# Patient Record
Sex: Male | Born: 1965 | Race: White | Hispanic: No | Marital: Married | Smoking: Former smoker
Health system: Southern US, Community
[De-identification: ages and names within clinical notes are randomized; demographics above are authoritative.]

## PROBLEM LIST (undated history)

## (undated) DIAGNOSIS — E7800 Pure hypercholesterolemia, unspecified: Secondary | ICD-10-CM

## (undated) DIAGNOSIS — G43909 Migraine, unspecified, not intractable, without status migrainosus: Secondary | ICD-10-CM

## (undated) DIAGNOSIS — G4733 Obstructive sleep apnea (adult) (pediatric): Secondary | ICD-10-CM

## (undated) HISTORY — DX: Obstructive sleep apnea (adult) (pediatric): G47.33

## (undated) HISTORY — PX: VASECTOMY: SHX75

## (undated) HISTORY — PX: COLONOSCOPY: SHX174

## (undated) HISTORY — DX: Migraine, unspecified, not intractable, without status migrainosus: G43.909

---

## 2011-09-13 ENCOUNTER — Ambulatory Visit: Payer: 59

## 2011-09-13 DIAGNOSIS — L02519 Cutaneous abscess of unspecified hand: Secondary | ICD-10-CM

## 2011-09-13 DIAGNOSIS — M25549 Pain in joints of unspecified hand: Secondary | ICD-10-CM

## 2016-06-16 ENCOUNTER — Other Ambulatory Visit: Payer: Self-pay | Admitting: Gastroenterology

## 2016-06-16 DIAGNOSIS — K529 Noninfective gastroenteritis and colitis, unspecified: Secondary | ICD-10-CM

## 2016-06-24 ENCOUNTER — Other Ambulatory Visit: Payer: Self-pay

## 2016-06-25 ENCOUNTER — Other Ambulatory Visit: Payer: Self-pay

## 2016-06-26 ENCOUNTER — Ambulatory Visit
Admission: RE | Admit: 2016-06-26 | Discharge: 2016-06-26 | Disposition: A | Discharge status: Home / Self Care | Payer: Managed Care, Other (non HMO) | Source: Ambulatory Visit | Attending: Gastroenterology | Admitting: Gastroenterology

## 2016-06-26 DIAGNOSIS — K529 Noninfective gastroenteritis and colitis, unspecified: Secondary | ICD-10-CM

## 2016-06-26 MED ORDER — IOPAMIDOL (ISOVUE-300) INJECTION 61%
100.0000 mL | Freq: Once | INTRAVENOUS | Status: AC | PRN
  Administered 2016-06-26: 100 mL via INTRAVENOUS

## 2016-07-02 ENCOUNTER — Ambulatory Visit
Admission: RE | Admit: 2016-07-02 | Discharge: 2016-07-02 | Disposition: A | Discharge status: Home / Self Care | Payer: Managed Care, Other (non HMO) | Source: Ambulatory Visit | Attending: Gastroenterology | Admitting: Gastroenterology

## 2016-07-02 ENCOUNTER — Other Ambulatory Visit: Payer: Self-pay | Admitting: Gastroenterology

## 2016-07-02 DIAGNOSIS — R935 Abnormal findings on diagnostic imaging of other abdominal regions, including retroperitoneum: Secondary | ICD-10-CM

## 2016-07-07 ENCOUNTER — Other Ambulatory Visit: Payer: Self-pay | Admitting: Physician Assistant

## 2016-07-07 DIAGNOSIS — R9389 Abnormal findings on diagnostic imaging of other specified body structures: Secondary | ICD-10-CM

## 2016-07-08 ENCOUNTER — Other Ambulatory Visit: Payer: Self-pay | Admitting: Physician Assistant

## 2016-07-08 DIAGNOSIS — R9389 Abnormal findings on diagnostic imaging of other specified body structures: Secondary | ICD-10-CM

## 2016-07-16 ENCOUNTER — Ambulatory Visit
Admission: RE | Admit: 2016-07-16 | Discharge: 2016-07-16 | Disposition: A | Discharge status: Home / Self Care | Payer: Managed Care, Other (non HMO) | Source: Ambulatory Visit | Attending: Physician Assistant | Admitting: Physician Assistant

## 2016-07-16 ENCOUNTER — Other Ambulatory Visit: Payer: Managed Care, Other (non HMO)

## 2016-07-16 DIAGNOSIS — R9389 Abnormal findings on diagnostic imaging of other specified body structures: Secondary | ICD-10-CM

## 2016-07-16 MED ORDER — GADOBENATE DIMEGLUMINE 529 MG/ML IV SOLN: 20.0000 mL | Freq: Once | INTRAVENOUS | Status: DC | PRN

## 2017-05-07 ENCOUNTER — Ambulatory Visit
Admission: RE | Admit: 2017-05-07 | Discharge: 2017-05-07 | Disposition: A | Discharge status: Home / Self Care | Payer: Managed Care, Other (non HMO) | Source: Ambulatory Visit | Attending: Physician Assistant | Admitting: Physician Assistant

## 2017-05-07 ENCOUNTER — Other Ambulatory Visit: Payer: Self-pay | Admitting: Physician Assistant

## 2017-05-07 DIAGNOSIS — R52 Pain, unspecified: Secondary | ICD-10-CM

## 2017-07-13 IMAGING — CR DG ORBITS FOR FOREIGN BODY
2 series · 2 of 2 positions shown · non-contrast
Comparison: None.

CLINICAL DATA: Metal working/exposure; clearance prior to MRI

EXAM:
ORBITS FOR FOREIGN BODY - 2 VIEW

[w orbit pa (1 of 2)]
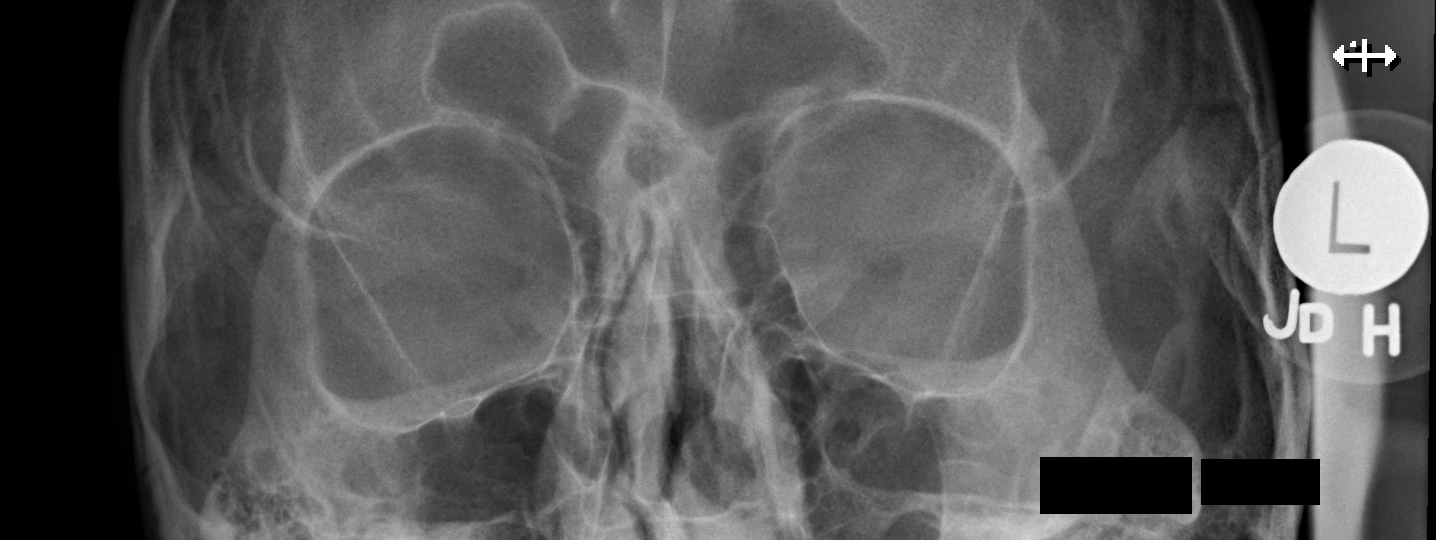

[w orbit pa (2 of 2)]
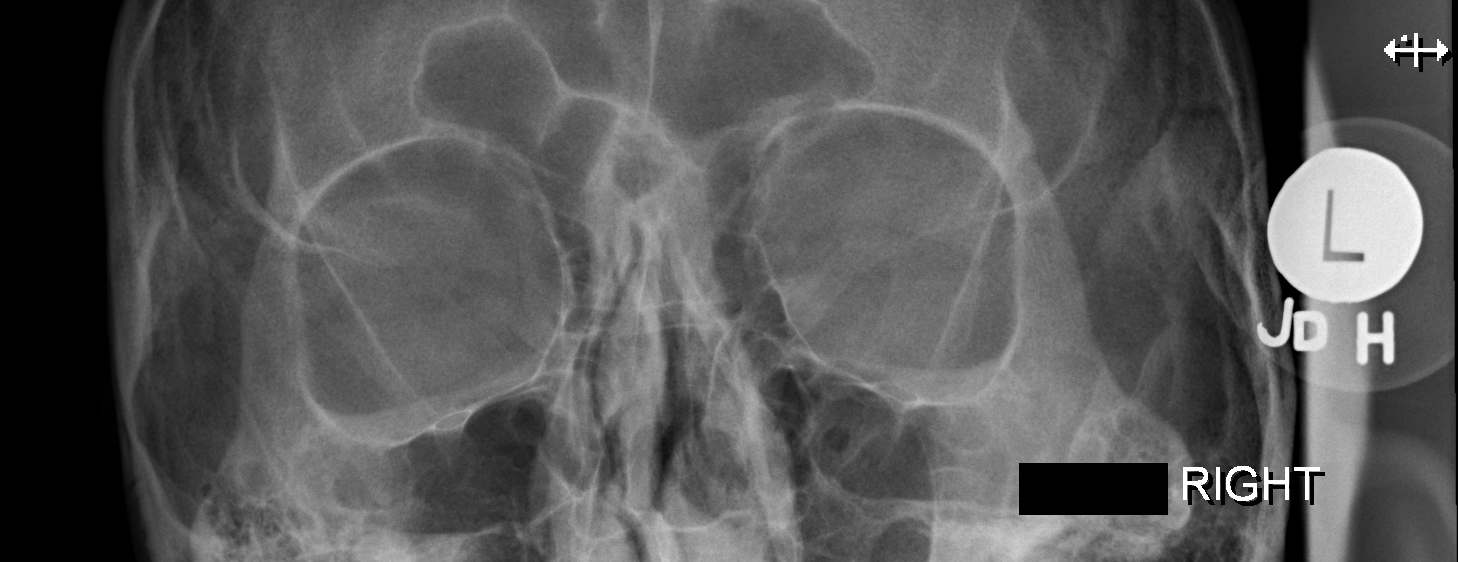

[2 of 2 positions shown; findings below may reference images not displayed]

FINDINGS: There is no evidence of metallic foreign body within the orbits. No
significant bone abnormality identified.
IMPRESSION: No evidence of metallic foreign body within the orbits.

## 2019-05-30 ENCOUNTER — Other Ambulatory Visit: Payer: Self-pay

## 2019-05-30 DIAGNOSIS — Z20822 Contact with and (suspected) exposure to covid-19: Secondary | ICD-10-CM

## 2019-05-31 LAB — NOVEL CORONAVIRUS, NAA: SARS-CoV-2, NAA: NOT DETECTED

## 2019-08-23 ENCOUNTER — Ambulatory Visit: Payer: Managed Care, Other (non HMO) | Attending: Internal Medicine

## 2019-08-23 DIAGNOSIS — Z20822 Contact with and (suspected) exposure to covid-19: Secondary | ICD-10-CM

## 2019-08-23 DIAGNOSIS — Z20828 Contact with and (suspected) exposure to other viral communicable diseases: Secondary | ICD-10-CM | POA: Insufficient documentation

## 2019-08-24 LAB — NOVEL CORONAVIRUS, NAA: SARS-CoV-2, NAA: NOT DETECTED

## 2019-09-26 ENCOUNTER — Encounter (HOSPITAL_COMMUNITY): Payer: Self-pay

## 2019-09-26 ENCOUNTER — Other Ambulatory Visit: Payer: Self-pay

## 2019-09-26 ENCOUNTER — Emergency Department (HOSPITAL_COMMUNITY): Payer: Managed Care, Other (non HMO)

## 2019-09-26 ENCOUNTER — Emergency Department (HOSPITAL_COMMUNITY)
Admission: EM | Admit: 2019-09-26 | Discharge: 2019-09-26 | Disposition: A | Discharge status: Home / Self Care | Payer: Managed Care, Other (non HMO) | Attending: Emergency Medicine | Admitting: Emergency Medicine

## 2019-09-26 DIAGNOSIS — N200 Calculus of kidney: Secondary | ICD-10-CM | POA: Insufficient documentation

## 2019-09-26 DIAGNOSIS — R1032 Left lower quadrant pain: Secondary | ICD-10-CM | POA: Diagnosis present

## 2019-09-26 HISTORY — DX: Pure hypercholesterolemia, unspecified: E78.00

## 2019-09-26 LAB — COMPREHENSIVE METABOLIC PANEL
ALT: 108 U/L — ABNORMAL HIGH (ref 0–44)
AST: 54 U/L — ABNORMAL HIGH (ref 15–41)
Albumin: 4.6 g/dL (ref 3.5–5.0)
Alkaline Phosphatase: 75 U/L (ref 38–126)
Anion gap: 13 (ref 5–15)
BUN: 16 mg/dL (ref 6–20)
CO2: 23 mmol/L (ref 22–32)
Calcium: 10.4 mg/dL — ABNORMAL HIGH (ref 8.9–10.3)
Chloride: 107 mmol/L (ref 98–111)
Creatinine, Ser: 1.19 mg/dL (ref 0.61–1.24)
GFR calc Af Amer: >60 mL/min (ref >60)
GFR calc non Af Amer: >60 mL/min (ref >60)
Glucose, Bld: 147 mg/dL — ABNORMAL HIGH (ref 70–99)
Potassium: 4.1 mmol/L (ref 3.5–5.1)
Sodium: 143 mmol/L (ref 135–145)
Total Bilirubin: 0.6 mg/dL (ref 0.3–1.2)
Total Protein: 8.5 g/dL — ABNORMAL HIGH (ref 6.5–8.1)

## 2019-09-26 LAB — URINALYSIS, ROUTINE W REFLEX MICROSCOPIC
Bilirubin Urine: NEGATIVE
Glucose, UA: NEGATIVE mg/dL
Hgb urine dipstick: NEGATIVE
Ketones, ur: NEGATIVE mg/dL
Leukocytes,Ua: NEGATIVE
Nitrite: NEGATIVE
Protein, ur: 100 mg/dL — AB
Specific Gravity, Urine: 1.027 (ref 1.005–1.030)
pH: 5 (ref 5.0–8.0)

## 2019-09-26 LAB — CBC
HCT: 46.8 % (ref 39.0–52.0)
Hemoglobin: 15.4 g/dL (ref 13.0–17.0)
MCH: 29.4 pg (ref 26.0–34.0)
MCHC: 32.9 g/dL (ref 30.0–36.0)
MCV: 89.3 fL (ref 80.0–100.0)
Platelets: 257 10*3/uL (ref 150–400)
RBC: 5.24 MIL/uL (ref 4.22–5.81)
RDW: 12 % (ref 11.5–15.5)
WBC: 8.1 10*3/uL (ref 4.0–10.5)
nRBC: 0 % (ref 0.0–0.2)

## 2019-09-26 LAB — LIPASE, BLOOD: Lipase: 33 U/L (ref 11–51)

## 2019-09-26 MED ORDER — OXYCODONE-ACETAMINOPHEN 5-325 MG PO TABS: 1.0000 | ORAL_TABLET | Freq: Four times a day (QID) | ORAL | 0 refills | Status: DC | PRN

## 2019-09-26 MED ORDER — NAPROXEN 500 MG PO TABS: 500.0000 mg | ORAL_TABLET | Freq: Two times a day (BID) | ORAL | 0 refills | Status: DC | PRN

## 2019-09-26 MED ORDER — MORPHINE SULFATE (PF) 4 MG/ML IV SOLN
4.0000 mg | Freq: Once | INTRAVENOUS | Status: AC
  Administered 2019-09-26: 4 mg via INTRAVENOUS
  Filled 2019-09-26: qty 1

## 2019-09-26 MED ORDER — ONDANSETRON HCL 4 MG/2ML IJ SOLN
4.0000 mg | Freq: Once | INTRAMUSCULAR | Status: AC
  Administered 2019-09-26: 4 mg via INTRAVENOUS
  Filled 2019-09-26: qty 2

## 2019-09-26 MED ORDER — KETOROLAC TROMETHAMINE 30 MG/ML IJ SOLN
30.0000 mg | Freq: Once | INTRAMUSCULAR | Status: AC
  Administered 2019-09-26: 30 mg via INTRAVENOUS
  Filled 2019-09-26: qty 1

## 2019-09-26 MED ORDER — TAMSULOSIN HCL 0.4 MG PO CAPS: 0.4000 mg | ORAL_CAPSULE | Freq: Every day | ORAL | 0 refills | Status: AC

## 2019-09-26 MED ORDER — IOHEXOL 300 MG/ML  SOLN
100.0000 mL | Freq: Once | INTRAMUSCULAR | Status: AC | PRN
  Administered 2019-09-26: 100 mL via INTRAVENOUS

## 2019-09-26 MED ORDER — SODIUM CHLORIDE 0.9% FLUSH: 3.0000 mL | Freq: Once | INTRAVENOUS | Status: DC

## 2019-09-26 MED ORDER — ONDANSETRON 4 MG PO TBDP: 4.0000 mg | ORAL_TABLET | Freq: Three times a day (TID) | ORAL | 0 refills | Status: DC | PRN

## 2019-09-26 NOTE — ED Provider Notes (Signed)
Emergency Department Provider Note   I have reviewed the triage vital signs and the nursing notes.   HISTORY  Chief Complaint Abdominal Pain and Nausea   HPI Ronald Lee is a 54 y.o. male with PMH of HLD presents to the emergency department for evaluation of acute onset left lower quadrant abdominal pain starting 2 hours prior to ED presentation.  Patient states that he has been treated for a urinary tract infection starting on Friday with Cipro prescribed by his PCP.  He was experiencing some mild dysuria with frequency and some urgency.  He continues to have these symptoms despite antibiotics over the past 3 days.  This morning, he developed acute, severe lower abdominal pain on the left side.  He describes it as pressure and sharp in quality.  No similar pain like this in the past.  No prior history of diverticulitis or ureterolithiasis.  He did have one episode of mild diarrhea this morning.  No nausea or vomiting.  No chest pain or shortness of breath symptoms.  No fever/chills.   Past Medical History:  Diagnosis Date  . High cholesterol     There are no problems to display for this patient.   History reviewed. No pertinent surgical history.  Allergies Patient has no known allergies.  No family history on file.  Social History Social History   Tobacco Use  . Smoking status: Not on file  Substance Use Topics  . Alcohol use: Not on file  . Drug use: Not on file    Review of Systems  Constitutional: No fever/chills Eyes: No visual changes. ENT: No sore throat. Cardiovascular: Denies chest pain. Respiratory: Denies shortness of breath. Gastrointestinal: Positive LLQ abdominal pain.  No nausea, no vomiting.  Positive diarrhea x 1.  No constipation. Genitourinary: Positive dysuria and frequency.  Musculoskeletal: Left flank pain this AM (resolved).  Skin: Negative for rash. Neurological: Negative for headaches, focal weakness or numbness.  10-point ROS otherwise  negative.  ____________________________________________   PHYSICAL EXAM:  VITAL SIGNS: ED Triage Vitals  Enc Vitals Group     BP 09/26/19 0832 (!) 140/5     Pulse Rate 09/26/19 0832 99     Resp 09/26/19 0832 20     Temp 09/26/19 0832 (!) 97.5 F (36.4 C)     Temp Source 09/26/19 0832 Oral     SpO2 09/26/19 0832 99 %   Constitutional: Alert and oriented. Appears uncomfortable but in no acute distress.  Eyes: Conjunctivae are normal. Head: Atraumatic. Nose: No congestion/rhinnorhea. Mouth/Throat: Mucous membranes are moist. Neck: No stridor.   Cardiovascular: Normal rate, regular rhythm. Good peripheral circulation. Grossly normal heart sounds.   Respiratory: Normal respiratory effort.  No retractions. Lungs CTAB. Gastrointestinal: Soft with focal LLQ tenderness w/o rebound. Mild voluntary guarding in the LLQ. Remaining abdomen with mild tenderness but nothing focal. No distention.  Musculoskeletal: No gross deformities of extremities. Neurologic:  Normal speech and language.  Skin:  Skin is warm, dry and intact. No rash noted.  ____________________________________________   LABS (all labs ordered are listed, but only abnormal results are displayed)  Labs Reviewed  COMPREHENSIVE METABOLIC PANEL - Abnormal; Notable for the following components:      Result Value   Glucose, Bld 147 (*)    Calcium 10.4 (*)    Total Protein 8.5 (*)    AST 54 (*)    ALT 108 (*)    All other components within normal limits  URINALYSIS, ROUTINE W REFLEX MICROSCOPIC - Abnormal;  Notable for the following components:   APPearance CLOUDY (*)    Protein, ur 100 (*)    Bacteria, UA FEW (*)    All other components within normal limits  URINE CULTURE  LIPASE, BLOOD  CBC   ____________________________________________  RADIOLOGY  CT ABDOMEN PELVIS W CONTRAST  Result Date: 09/26/2019 CLINICAL DATA:  Left lower quadrant pain. Diverticulitis versus kidney stone. EXAM: CT ABDOMEN AND PELVIS WITH  CONTRAST TECHNIQUE: Multidetector CT imaging of the abdomen and pelvis was performed using the standard protocol following bolus administration of intravenous contrast. CONTRAST:  171mL OMNIPAQUE IOHEXOL 300 MG/ML  SOLN COMPARISON:  CT abdomen pelvis 06/26/2016 FINDINGS: Lower chest: Lung bases clear bilaterally. Hepatobiliary: Fatty infiltration of liver without focal liver lesion. Gallbladder and bile ducts negative. Pancreas: Negative Spleen: Negative Adrenals/Urinary Tract: Mild left hydronephrosis and hydroureter. Mild stranding friend in the left kidney due to obstruction. 1 x 2 mm calculus distal left ureter at the UV junction causing obstruction. No other renal calculi. No renal mass. Stomach/Bowel: Stomach is within normal limits. Appendix appears normal. No evidence of bowel wall thickening, distention, or inflammatory changes. Vascular/Lymphatic: Mild atherosclerotic aorta without aneurysm. Negative for lymphadenopathy. Reproductive: Prostate is unremarkable. Other: No free fluid or mass lesion. Musculoskeletal: Negative IMPRESSION: 1 x 2 mm stone distal left ureter causing obstruction. Negative for diverticulitis.  Normal appendix. Electronically Signed   By: Franchot Gallo M.D.   On: 09/26/2019 11:16    ____________________________________________   PROCEDURES  Procedure(s) performed:   Procedures  None  ____________________________________________   INITIAL IMPRESSION / ASSESSMENT AND PLAN / ED COURSE  Pertinent labs & imaging results that were available during my care of the patient were reviewed by me and considered in my medical decision making (see chart for details).   Patient presents to the emergency department for evaluation of acute onset left lower quadrant abdominal pain starting this morning.  He has been on Cipro for the past 3 days for presumed urinary tract infection.  Afebrile here with mild elevated blood pressure and borderline tachycardia.  Patient does appear  uncomfortable.  Differential at this time includes kidney stone, diverticulitis versus colitis.  Very low suspicion clinically for aortic dissection/symptomatic aneurysm.  Plan for CT abdomen pelvis with contrast along with labs and have ordered pain/nausea medication.   11:35 AM  CT scan showing 1 to 2 mm left UVJ stone which correlates with the patient's symptoms.  No evidence of urine infection on UA.  Patient's symptoms are well controlled here in the emergency department after Toradol.  Plan for NSAIDs, Percocet, Flomax.  Discussed diagnosis and ED return precautions for fever or other infection symptoms.  Provided contact information for urology for follow-up.  Reviewed the [facility] drug database prior to prescribing Percocet.  Patient is comfortable with the plan at discharge.  ____________________________________________  FINAL CLINICAL IMPRESSION(S) / ED DIAGNOSES  Final diagnoses:  LLQ abdominal pain  Kidney stone     MEDICATIONS GIVEN DURING THIS VISIT:  Medications  sodium chloride flush (NS) 0.9 % injection 3 mL (has no administration in time range)  morphine 4 MG/ML injection 4 mg (4 mg Intravenous Given 09/26/19 0906)  ondansetron (ZOFRAN) injection 4 mg (4 mg Intravenous Given 09/26/19 0906)  ketorolac (TORADOL) 30 MG/ML injection 30 mg (30 mg Intravenous Given 09/26/19 1009)  iohexol (OMNIPAQUE) 300 MG/ML solution 100 mL (100 mLs Intravenous Contrast Given 09/26/19 1055)     NEW OUTPATIENT MEDICATIONS STARTED DURING THIS VISIT:  New Prescriptions   NAPROXEN (  NAPROSYN) 500 MG TABLET    Take 1 tablet (500 mg total) by mouth 2 (two) times daily as needed for moderate pain.   ONDANSETRON (ZOFRAN ODT) 4 MG DISINTEGRATING TABLET    Take 1 tablet (4 mg total) by mouth every 8 (eight) hours as needed for nausea or vomiting.   OXYCODONE-ACETAMINOPHEN (PERCOCET/ROXICET) 5-325 MG TABLET    Take 1 tablet by mouth every 6 (six) hours as needed for severe pain.   TAMSULOSIN (FLOMAX)  0.4 MG CAPS CAPSULE    Take 1 capsule (0.4 mg total) by mouth daily for 14 days.    Note:  This document was prepared using Dragon voice recognition software and may include unintentional dictation errors.  Alona Bene, MD, [facility]    Aikam Hellickson, Arlyss Repress, MD 09/26/19 [phone]

## 2019-09-26 NOTE — ED Triage Notes (Signed)
Pt reports severe LLQ pain that started in his lower back and radiates to the front. Pt also having nausea, no vomiting. States he was diagnosed with UTI on Friday and given antibiotics but he woke up in severe pain this morning.

## 2019-09-26 NOTE — Discharge Instructions (Signed)

## 2019-09-27 LAB — URINE CULTURE: Culture: NO GROWTH

## 2019-11-17 ENCOUNTER — Other Ambulatory Visit: Payer: Self-pay | Admitting: Gastroenterology

## 2019-11-17 ENCOUNTER — Encounter: Payer: Self-pay | Admitting: Pulmonary Disease

## 2019-11-17 DIAGNOSIS — R911 Solitary pulmonary nodule: Secondary | ICD-10-CM

## 2019-11-29 ENCOUNTER — Ambulatory Visit
Admission: RE | Admit: 2019-11-29 | Discharge: 2019-11-29 | Disposition: A | Discharge status: Home / Self Care | Payer: Managed Care, Other (non HMO) | Source: Ambulatory Visit | Attending: Gastroenterology | Admitting: Gastroenterology

## 2019-11-29 ENCOUNTER — Other Ambulatory Visit: Payer: Self-pay

## 2019-11-29 ENCOUNTER — Other Ambulatory Visit: Payer: Managed Care, Other (non HMO)

## 2019-11-29 DIAGNOSIS — R911 Solitary pulmonary nodule: Secondary | ICD-10-CM

## 2020-01-02 ENCOUNTER — Encounter: Payer: Self-pay | Admitting: Pulmonary Disease

## 2020-01-02 ENCOUNTER — Other Ambulatory Visit: Payer: Self-pay

## 2020-01-02 ENCOUNTER — Ambulatory Visit (INDEPENDENT_AMBULATORY_CARE_PROVIDER_SITE_OTHER): Payer: Managed Care, Other (non HMO) | Admitting: Pulmonary Disease

## 2020-01-02 VITALS — BP 130/80 | HR 87 | Ht 72.0 in | Wt 244.6 lb

## 2020-01-02 DIAGNOSIS — J438 Other emphysema: Secondary | ICD-10-CM | POA: Diagnosis not present

## 2020-01-02 DIAGNOSIS — Z87891 Personal history of nicotine dependence: Secondary | ICD-10-CM

## 2020-01-02 DIAGNOSIS — R918 Other nonspecific abnormal finding of lung field: Secondary | ICD-10-CM | POA: Diagnosis not present

## 2020-01-02 NOTE — Patient Instructions (Addendum)
Thank you for visiting Dr. Tonia Brooms at [facility]. Today we recommend the following:  Orders Placed This Encounter  Procedures  . CT Chest Wo Contrast   Follow up in clinic after CT images.   Return in about 6 months (around 07/04/2020) for w/ Arkin Imran .    Please do your part to reduce the spread of COVID-19.

## 2020-01-02 NOTE — Progress Notes (Signed)
Synopsis: Referred in May 2021 for multiple pulmonary nodules by Otis Brace, MD  Subjective:   PATIENT ID: Ronald Lee GENDER: male DOB: [birth year], MRN: [id number]  Chief Complaint  Patient presents with  . Consult    Pt states he had a CT scan performed which showed lung nodules. Pt states he will occ become SOB.    This is a 54 year old gentleman with a past medical history of hyperlipidemia, migraines, severe OSA, severe nocturnal hypoxemia with a home sleep study on 10/03/2016, AHI of 54.  He had a lung nodule also initially seen on CT imaging in 2017 with no additional follow-up.  Surgical history to include vasectomy, colonoscopy in 2017, family history of father deceased, hyperlipidemia, Alzheimer's, hypertension mother, alive.  He is a former smoker quit in 1997, tobacco history.  He is married with 3 children.  He works at [employer].  Patient was referred for evaluation after having CT scan of the chest which revealed pulmonary nodules.  11/29/2019 CT chest revealed paraseptal emphysema in the lung apices with associated multiple pulmonary nodules the largest being 6 mm in the right lung base.  He has not had any previous pulmonary function tests on record no prior diagnosis of COPD.  However CT imaging does have evidence of emphysema.  OV 01/02/2020: Patient has no complaints today.  He works as a Freight forwarder.  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  He is slightly concerned today about this.  He did quit smoking many years ago.  No respiratory complaints at this time.  Denies hemoptysis.  Denies weight loss.   Past Medical History:  Diagnosis Date  . High cholesterol   . Migraine   . OSA (obstructive sleep apnea)      Family History  Problem Relation Age of Onset  . Colon polyps Mother   . Alzheimer's disease Father   . Hyperlipidemia Father   . Hypertension Father      Past Surgical History:  Procedure Laterality Date  . COLONOSCOPY    . VASECTOMY       Social History   Socioeconomic History  . Marital status: Married    Spouse name: Not on file  . Number of children: Not on file  . Years of education: Not on file  . Highest education level: Not on file  Occupational History  . Not on file  Tobacco Use  . Smoking status: Former Smoker    Packs/day: 1.00    Years: 10.00    Pack years: 10.00    Types: Cigarettes    Quit date: 1997    Years since quitting: 24.3  . Smokeless tobacco: Current User  Substance and Sexual Activity  . Alcohol use: Not on file  . Drug use: Not on file  . Sexual activity: Not on file  Other Topics Concern  . Not on file  Social History Narrative  . Not on file   Social Determinants of Health   Financial Resource Strain:   . Difficulty of Paying Living Expenses:   Food Insecurity:   . Worried About Charity fundraiser in the Last Year:   . Arboriculturist in the Last Year:   Transportation Needs:   . Film/video editor (Medical):   Marland Kitchen Lack of Transportation (Non-Medical):   Physical Activity:   . Days of Exercise per Week:   . Minutes of Exercise per Session:   Stress:   . Feeling of Stress :   Social Connections:   .  Frequency of Communication with Friends and Family:   . Frequency of Social Gatherings with Friends and Family:   . Attends Religious Services:   . Active Member of Clubs or Organizations:   . Attends Banker Meetings:   Marland Kitchen Marital Status:   Intimate Partner Violence:   . Fear of Current or Ex-Partner:   . Emotionally Abused:   Marland Kitchen Physically Abused:   . Sexually Abused:      No Known Allergies   Outpatient Medications Prior to Visit  Medication Sig Dispense Refill  . atorvastatin (LIPITOR) 20 MG tablet Take 20 mg by mouth daily.    . fexofenadine (ALLEGRA) 60 MG tablet Take 60 mg by mouth as needed for allergies or rhinitis.    . fluticasone (FLONASE) 50 MCG/ACT nasal spray Place 2 sprays into both nostrils daily.    . Multiple Vitamin  (MULTIVITAMIN WITH MINERALS) TABS tablet Take 1 tablet by mouth daily.    Marland Kitchen VITAMIN D PO Take 1 tablet by mouth daily.    . ciprofloxacin (CIPRO) 500 MG tablet Take 500 mg by mouth 2 (two) times daily.    . meloxicam (MOBIC) 7.5 MG tablet Take 7.5 mg by mouth daily.    . naproxen (NAPROSYN) 500 MG tablet Take 1 tablet (500 mg total) by mouth 2 (two) times daily as needed for moderate pain. 30 tablet 0  . ondansetron (ZOFRAN ODT) 4 MG disintegrating tablet Take 1 tablet (4 mg total) by mouth every 8 (eight) hours as needed for nausea or vomiting. 20 tablet 0  . oxyCODONE-acetaminophen (PERCOCET/ROXICET) 5-325 MG tablet Take 1 tablet by mouth every 6 (six) hours as needed for severe pain. 15 tablet 0   No facility-administered medications prior to visit.    Review of Systems  Constitutional: Negative for chills, fever, malaise/fatigue and weight loss.  HENT: Negative for hearing loss, sore throat and tinnitus.   Eyes: Negative for blurred vision and double vision.  Respiratory: Negative for cough, hemoptysis, sputum production, shortness of breath, wheezing and stridor.   Cardiovascular: Negative for chest pain, palpitations, orthopnea, leg swelling and PND.  Gastrointestinal: Negative for abdominal pain, constipation, diarrhea, heartburn, nausea and vomiting.  Genitourinary: Negative for dysuria, hematuria and urgency.  Musculoskeletal: Negative for joint pain and myalgias.  Skin: Negative for itching and rash.  Neurological: Negative for dizziness, tingling, weakness and headaches.  Endo/Heme/Allergies: Negative for environmental allergies. Does not bruise/bleed easily.  Psychiatric/Behavioral: Negative for depression. The patient is not nervous/anxious and does not have insomnia.   All other systems reviewed and are negative.    Objective:  Physical Exam Vitals reviewed.  Constitutional:      General: He is not in acute distress.    Appearance: He is well-developed. He is obese.    HENT:     Head: Normocephalic and atraumatic.  Eyes:     General: No scleral icterus.    Conjunctiva/sclera: Conjunctivae normal.     Pupils: Pupils are equal, round, and reactive to light.  Neck:     Vascular: No JVD.     Trachea: No tracheal deviation.  Cardiovascular:     Rate and Rhythm: Normal rate and regular rhythm.     Heart sounds: Normal heart sounds. No murmur.  Pulmonary:     Effort: Pulmonary effort is normal. No tachypnea, accessory muscle usage or respiratory distress.     Breath sounds: Normal breath sounds. No stridor. No wheezing, rhonchi or rales.  Abdominal:     General: Bowel sounds  are normal. There is no distension.     Palpations: Abdomen is soft.     Tenderness: There is no abdominal tenderness.  Musculoskeletal:        General: No tenderness.     Cervical back: Neck supple.  Lymphadenopathy:     Cervical: No cervical adenopathy.  Skin:    General: Skin is warm and dry.     Capillary Refill: Capillary refill takes less than 2 seconds.     Findings: No rash.  Neurological:     Mental Status: He is alert and oriented to person, place, and time.  Psychiatric:        Behavior: Behavior normal.      Vitals:   01/02/20 1401  BP: 130/80  Pulse: 87  SpO2: 98%  Weight: 244 lb 9.6 oz (110.9 kg)  Height: 6' (1.829 m)   98% on RA BMI Readings from Last 3 Encounters:  01/02/20 33.17 kg/m   Wt Readings from Last 3 Encounters:  01/02/20 244 lb 9.6 oz (110.9 kg)     CBC    Component Value Date/Time   WBC 8.1 09/26/2019 0850   RBC 5.24 09/26/2019 0850   HGB 15.4 09/26/2019 0850   HCT 46.8 09/26/2019 0850   PLT 257 09/26/2019 0850   MCV 89.3 09/26/2019 0850   MCH 29.4 09/26/2019 0850   MCHC 32.9 09/26/2019 0850   RDW 12.0 09/26/2019 0850    Chest Imaging: 11/29/2019 CT chest: Evidence of paraseptal emphysema in the lung apices, centrilobular emphysema, multiple pulmonary nodules largest 6 mm in size.  Former smoker. The patient's images  have been independently reviewed by me.    Pulmonary Functions Testing Results: No flowsheet data found.  FeNO: none  Pathology: none  Echocardiogram: none  Heart Catheterization: none    Assessment & Plan:     ICD-10-CM   1. Multiple pulmonary nodules  R91.8 CT Chest Wo Contrast  2. Paraseptal emphysema (HCC)  J43.8   3. Former smoker  Z87.891     Discussion: This is a 54 year old gentleman with incidental found pulmonary nodule.  Largest in the right lower lobe of 6 mm.  Also has associated upper lobe paraseptal emphysema.  Former smoker quit 1997.  Image interpretation and review with patient today as stated above.  Plan Following Data Review & Interpretation:  . I reviewed prior external note(s) from Dr. Moss Mc physicians, 10/25/2019 I have ordered a repeat noncontrasted CT of the chest in 6 months. Patient return to clinic in 6 months following CT imaging.    Current Outpatient Medications:  .  atorvastatin (LIPITOR) 20 MG tablet, Take 20 mg by mouth daily., Disp: , Rfl:  .  fexofenadine (ALLEGRA) 60 MG tablet, Take 60 mg by mouth as needed for allergies or rhinitis., Disp: , Rfl:  .  fluticasone (FLONASE) 50 MCG/ACT nasal spray, Place 2 sprays into both nostrils daily., Disp: , Rfl:  .  Multiple Vitamin (MULTIVITAMIN WITH MINERALS) TABS tablet, Take 1 tablet by mouth daily., Disp: , Rfl:  .  VITAMIN D PO, Take 1 tablet by mouth daily., Disp: , Rfl:    Josephine Igo, DO [facility] 01/02/2020 2:18 PM

## 2020-06-19 ENCOUNTER — Ambulatory Visit (INDEPENDENT_AMBULATORY_CARE_PROVIDER_SITE_OTHER)
Admission: RE | Admit: 2020-06-19 | Discharge: 2020-06-19 | Disposition: A | Discharge status: Home / Self Care | Payer: Managed Care, Other (non HMO) | Source: Ambulatory Visit | Attending: Pulmonary Disease | Admitting: Pulmonary Disease

## 2020-06-19 ENCOUNTER — Other Ambulatory Visit: Payer: Self-pay

## 2020-06-19 DIAGNOSIS — R918 Other nonspecific abnormal finding of lung field: Secondary | ICD-10-CM | POA: Diagnosis not present

## 2020-06-28 ENCOUNTER — Encounter: Payer: Self-pay | Admitting: Pulmonary Disease

## 2020-06-28 ENCOUNTER — Other Ambulatory Visit: Payer: Self-pay

## 2020-06-28 ENCOUNTER — Ambulatory Visit (INDEPENDENT_AMBULATORY_CARE_PROVIDER_SITE_OTHER): Payer: Managed Care, Other (non HMO) | Admitting: Pulmonary Disease

## 2020-06-28 VITALS — BP 126/78 | HR 74 | Temp 97.2°F | Wt 253.5 lb

## 2020-06-28 DIAGNOSIS — Z87891 Personal history of nicotine dependence: Secondary | ICD-10-CM

## 2020-06-28 DIAGNOSIS — J438 Other emphysema: Secondary | ICD-10-CM

## 2020-06-28 DIAGNOSIS — R918 Other nonspecific abnormal finding of lung field: Secondary | ICD-10-CM | POA: Diagnosis not present

## 2020-06-28 NOTE — Patient Instructions (Addendum)
Thank you for visiting Dr. Tonia Brooms at [facility]. Today we recommend the following:  Orders Placed This Encounter  Procedures  . CT CHEST WO CONTRAST   Please schedule ct chest in 2 years, Oct 2023  Return in about 2 years (around 06/28/2022) for with APP or Dr. Tonia Brooms.    Please do your part to reduce the spread of COVID-19.

## 2020-06-28 NOTE — Progress Notes (Signed)
Synopsis: Referred in May 2021 for multiple pulmonary nodules by Scifres, Nicole Cella, PA-C  Subjective:   PATIENT ID: Ronald Lee GENDER: male DOB: [birth year], MRN: [id number]  Chief Complaint  Patient presents with  . Follow-up    6 month follow up---CT done 10/26    This is a 54 year old gentleman with a past medical history of hyperlipidemia, migraines, severe OSA, severe nocturnal hypoxemia with a home sleep study on 10/03/2016, AHI of 54.  He had a lung nodule also initially seen on CT imaging in 2017 with no additional follow-up.  Surgical history to include vasectomy, colonoscopy in 2017, family history of father deceased, hyperlipidemia, Alzheimer's, hypertension mother, alive.  He is a former smoker quit in 1997, tobacco history.  He is married with 3 children.  He works at [employer].  Patient was referred for evaluation after having CT scan of the chest which revealed pulmonary nodules.  11/29/2019 CT chest revealed paraseptal emphysema in the lung apices with associated multiple pulmonary nodules the largest being 6 mm in the right lung base.  He has not had any previous pulmonary function tests on record no prior diagnosis of COPD.  However CT imaging does have evidence of emphysema.  OV 01/02/2020: Patient has no complaints today.  He works as a Estate agent.  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  He is slightly concerned today about this.  He did quit smoking many years ago.  No respiratory complaints at this time.  Denies hemoptysis.  Denies weight loss.  OV 06/28/2020: Patient here today for follow-up regarding CT scan of the chest and previous lung nodules.  He does have severe OSA.  He had follow-up imaging of his chest after finding incidental pulmonary nodules all subcentimeter in size.  Repeat imaging was completed last week which revealed similar stable right middle lobe and bilateral lower lobe pulmonary nodules.  Due to his prior smoking history and evidence of  emphysema is considered high risk for these nodules.  Patient now has had subsequent imaging from April 2021 to October 2021 that has documented stability.  We discussed a 1 year follow-up versus 24 months.  Patient does not seem to be concerned about the nodules today would like to stretch out the time between now and the next images.  He is no longer smoking.  At this time has no respiratory complaints.   Past Medical History:  Diagnosis Date  . High cholesterol   . Migraine   . OSA (obstructive sleep apnea)      Family History  Problem Relation Age of Onset  . Colon polyps Mother   . Alzheimer's disease Father   . Hyperlipidemia Father   . Hypertension Father      Past Surgical History:  Procedure Laterality Date  . COLONOSCOPY    . VASECTOMY      Social History   Socioeconomic History  . Marital status: Married    Spouse name: Not on file  . Number of children: Not on file  . Years of education: Not on file  . Highest education level: Not on file  Occupational History  . Not on file  Tobacco Use  . Smoking status: Former Smoker    Packs/day: 1.00    Years: 10.00    Pack years: 10.00    Types: Cigarettes    Quit date: 1997    Years since quitting: 24.8  . Smokeless tobacco: Current User  Substance and Sexual Activity  . Alcohol use: Not on file  .  Drug use: Not on file  . Sexual activity: Not on file  Other Topics Concern  . Not on file  Social History Narrative  . Not on file   Social Determinants of Health   Financial Resource Strain:   . Difficulty of Paying Living Expenses: Not on file  Food Insecurity:   . Worried About Programme researcher, broadcasting/film/videounning Out of Food in the Last Year: Not on file  . Ran Out of Food in the Last Year: Not on file  Transportation Needs:   . Lack of Transportation (Medical): Not on file  . Lack of Transportation (Non-Medical): Not on file  Physical Activity:   . Days of Exercise per Week: Not on file  . Minutes of Exercise per Session: Not on  file  Stress:   . Feeling of Stress : Not on file  Social Connections:   . Frequency of Communication with Friends and Family: Not on file  . Frequency of Social Gatherings with Friends and Family: Not on file  . Attends Religious Services: Not on file  . Active Member of Clubs or Organizations: Not on file  . Attends BankerClub or Organization Meetings: Not on file  . Marital Status: Not on file  Intimate Partner Violence:   . Fear of Current or Ex-Partner: Not on file  . Emotionally Abused: Not on file  . Physically Abused: Not on file  . Sexually Abused: Not on file     No Known Allergies   Outpatient Medications Prior to Visit  Medication Sig Dispense Refill  . atorvastatin (LIPITOR) 20 MG tablet Take 20 mg by mouth daily.    . fexofenadine (ALLEGRA) 60 MG tablet Take 60 mg by mouth as needed for allergies or rhinitis.    . fluticasone (FLONASE) 50 MCG/ACT nasal spray Place 2 sprays into both nostrils daily.    . Multiple Vitamin (MULTIVITAMIN WITH MINERALS) TABS tablet Take 1 tablet by mouth daily.    Marland Kitchen. VITAMIN D PO Take 1 tablet by mouth daily.     No facility-administered medications prior to visit.    Review of Systems  Constitutional: Negative for chills, fever, malaise/fatigue and weight loss.  HENT: Negative for hearing loss, sore throat and tinnitus.   Eyes: Negative for blurred vision and double vision.  Respiratory: Negative for cough, hemoptysis, sputum production, shortness of breath, wheezing and stridor.   Cardiovascular: Negative for chest pain, palpitations, orthopnea, leg swelling and PND.  Gastrointestinal: Negative for abdominal pain, constipation, diarrhea, heartburn, nausea and vomiting.  Genitourinary: Negative for dysuria, hematuria and urgency.  Musculoskeletal: Negative for joint pain and myalgias.  Skin: Negative for itching and rash.  Neurological: Negative for dizziness, tingling, weakness and headaches.  Endo/Heme/Allergies: Negative for  environmental allergies. Does not bruise/bleed easily.  Psychiatric/Behavioral: Negative for depression. The patient is not nervous/anxious and does not have insomnia.   All other systems reviewed and are negative.    Objective:  Physical Exam Vitals reviewed.  Constitutional:      General: He is not in acute distress.    Appearance: He is well-developed.  HENT:     Head: Normocephalic and atraumatic.  Eyes:     General: No scleral icterus.    Conjunctiva/sclera: Conjunctivae normal.     Pupils: Pupils are equal, round, and reactive to light.  Neck:     Vascular: No JVD.     Trachea: No tracheal deviation.  Cardiovascular:     Rate and Rhythm: Normal rate and regular rhythm.     Heart  sounds: Normal heart sounds. No murmur heard.   Pulmonary:     Effort: Pulmonary effort is normal. No tachypnea, accessory muscle usage or respiratory distress.     Breath sounds: Normal breath sounds. No stridor. No wheezing, rhonchi or rales.  Abdominal:     General: Bowel sounds are normal. There is no distension.     Palpations: Abdomen is soft.     Tenderness: There is no abdominal tenderness.  Musculoskeletal:        General: No tenderness.     Cervical back: Neck supple.  Lymphadenopathy:     Cervical: No cervical adenopathy.  Skin:    General: Skin is warm and dry.     Capillary Refill: Capillary refill takes less than 2 seconds.     Findings: No rash.  Neurological:     Mental Status: He is alert and oriented to person, place, and time.  Psychiatric:        Behavior: Behavior normal.      Vitals:   06/28/20 1057  BP: 126/78  Pulse: 74  Temp: (!) 97.2 F (36.2 C)  TempSrc: Tympanic  SpO2: 98%  Weight: 253 lb 8 oz (115 kg)   98% on RA BMI Readings from Last 3 Encounters:  06/28/20 34.38 kg/m  01/02/20 33.17 kg/m   Wt Readings from Last 3 Encounters:  06/28/20 253 lb 8 oz (115 kg)  01/02/20 244 lb 9.6 oz (110.9 kg)    CBC    Component Value Date/Time   WBC  8.1 09/26/2019 0850   RBC 5.24 09/26/2019 0850   HGB 15.4 09/26/2019 0850   HCT 46.8 09/26/2019 0850   PLT 257 09/26/2019 0850   MCV 89.3 09/26/2019 0850   MCH 29.4 09/26/2019 0850   MCHC 32.9 09/26/2019 0850   RDW 12.0 09/26/2019 0850    Chest Imaging: 11/29/2019 CT chest: Evidence of paraseptal emphysema in the lung apices, centrilobular emphysema, multiple pulmonary nodules largest 6 mm in size.  Former smoker. The patient's images have been independently reviewed by me.   06/19/2020: CT Chest  Noncontrasted CT of the chest reveals stable right middle lobe and lower lobe bilateral subcentimeter pulmonary nodules.  Evidence of emphysema.The patient's images have been independently reviewed by me.    Pulmonary Functions Testing Results: No flowsheet data found.  FeNO: none  Pathology: none  Echocardiogram: none  Heart Catheterization: none    Assessment & Plan:     ICD-10-CM   1. Multiple lung nodules  R91.8 CT CHEST WO CONTRAST  2. Paraseptal emphysema (HCC)  J43.8   3. Former smoker  Z87.891     Discussion: This is a 54 year old gentleman found to have incidental multiple pulmonary nodules.  Largest remained stable at around 6 mm in size.  He also has associated paraseptal emphysema.  Former smoker that quit in 1997.  Today we discussed the risk benefits and alternatives of annual screening but these lung nodules.  He is 20+ years out from his smoking cessation.  Also does not have the pack year history necessary for enrolling in our lung cancer screening.  Due to the small size and the stability at this time patient would like to opt for a 2-year follow-up.  I think this is reasonable.  Plan: Noncontrasted CT of the chest in 2 years. Patient can follow-up after the CT scan of the chest. Continue to remain smoke-free. Patient has no current respiratory symptoms but at some point may develop COPD-like symptoms and consideration for pulmonary function  test would be  appropriate.  I spent 22 minutes dedicated to the care of this patient on the date of this encounter to include pre-visit review of records, face-to-face time with the patient discussing conditions above, post visit ordering of testing, clinical documentation with the electronic health record, making appropriate referrals as documented, and communicating necessary findings to members of the patients care team.     Current Outpatient Medications:  .  atorvastatin (LIPITOR) 20 MG tablet, Take 20 mg by mouth daily., Disp: , Rfl:  .  fexofenadine (ALLEGRA) 60 MG tablet, Take 60 mg by mouth as needed for allergies or rhinitis., Disp: , Rfl:  .  fluticasone (FLONASE) 50 MCG/ACT nasal spray, Place 2 sprays into both nostrils daily., Disp: , Rfl:  .  Multiple Vitamin (MULTIVITAMIN WITH MINERALS) TABS tablet, Take 1 tablet by mouth daily., Disp: , Rfl:  .  VITAMIN D PO, Take 1 tablet by mouth daily., Disp: , Rfl:    Josephine Igo, DO [facility] 06/28/2020 11:20 AM

## 2021-11-25 ENCOUNTER — Other Ambulatory Visit: Payer: Self-pay | Admitting: Podiatry

## 2021-11-25 DIAGNOSIS — M722 Plantar fascial fibromatosis: Secondary | ICD-10-CM

## 2021-11-25 DIAGNOSIS — M25571 Pain in right ankle and joints of right foot: Secondary | ICD-10-CM

## 2021-12-16 ENCOUNTER — Inpatient Hospital Stay: Admission: RE | Admit: 2021-12-16 | Payer: Managed Care, Other (non HMO) | Source: Ambulatory Visit

## 2022-02-13 ENCOUNTER — Ambulatory Visit
Admission: RE | Admit: 2022-02-13 | Discharge: 2022-02-13 | Disposition: A | Discharge status: Home / Self Care | Payer: Managed Care, Other (non HMO) | Source: Ambulatory Visit | Attending: Podiatry | Admitting: Podiatry

## 2022-02-13 DIAGNOSIS — M25571 Pain in right ankle and joints of right foot: Secondary | ICD-10-CM

## 2022-02-13 DIAGNOSIS — M722 Plantar fascial fibromatosis: Secondary | ICD-10-CM

## 2022-02-13 MED ORDER — GADOBENATE DIMEGLUMINE 529 MG/ML IV SOLN
20.0000 mL | Freq: Once | INTRAVENOUS | Status: AC | PRN
  Administered 2022-02-13: 20 mL via INTRAVENOUS

## 2022-04-02 ENCOUNTER — Telehealth: Payer: Self-pay | Admitting: Pulmonary Disease

## 2022-04-02 DIAGNOSIS — R918 Other nonspecific abnormal finding of lung field: Secondary | ICD-10-CM

## 2022-04-02 NOTE — Telephone Encounter (Signed)
New order has been placed. Nothing further needed.  

## 2022-05-28 ENCOUNTER — Other Ambulatory Visit: Payer: Managed Care, Other (non HMO)

## 2022-05-29 ENCOUNTER — Ambulatory Visit: Payer: Managed Care, Other (non HMO) | Admitting: Pulmonary Disease

## 2022-06-13 ENCOUNTER — Ambulatory Visit
Admission: RE | Admit: 2022-06-13 | Discharge: 2022-06-13 | Disposition: A | Discharge status: Home / Self Care | Payer: Managed Care, Other (non HMO) | Source: Ambulatory Visit | Attending: Pulmonary Disease

## 2022-06-13 DIAGNOSIS — R918 Other nonspecific abnormal finding of lung field: Secondary | ICD-10-CM

## 2022-06-25 ENCOUNTER — Ambulatory Visit (INDEPENDENT_AMBULATORY_CARE_PROVIDER_SITE_OTHER): Payer: Managed Care, Other (non HMO) | Admitting: Pulmonary Disease

## 2022-06-25 ENCOUNTER — Encounter: Payer: Self-pay | Admitting: Pulmonary Disease

## 2022-06-25 VITALS — BP 122/80 | HR 66 | Ht 72.0 in | Wt 244.6 lb

## 2022-06-25 DIAGNOSIS — R918 Other nonspecific abnormal finding of lung field: Secondary | ICD-10-CM

## 2022-06-25 DIAGNOSIS — Z87891 Personal history of nicotine dependence: Secondary | ICD-10-CM

## 2022-06-25 DIAGNOSIS — J438 Other emphysema: Secondary | ICD-10-CM

## 2022-06-25 NOTE — Patient Instructions (Signed)
Thank you for visiting Dr. Valeta Harms at [facility]. Today we recommend the following:  Call us if you need anything  No additional follow up needed at this time for your lung nodules.   Return if symptoms worsen or fail to improve.    Please do your part to reduce the spread of COVID-19.

## 2022-06-25 NOTE — Progress Notes (Signed)
Synopsis: Referred in May 2021 for multiple pulmonary nodules by No ref. provider found  Subjective:   PATIENT ID: Ronald Lee GENDER: male DOB: 02-Dec-1965, MRN: [id number]  Chief Complaint  Patient presents with   Follow-up    CT    This is a 56 year old gentleman with a past medical history of hyperlipidemia, migraines, severe OSA, severe nocturnal hypoxemia with a home sleep study on 10/03/2016, AHI of 54.  He had a lung nodule also initially seen on CT imaging in 2017 with no additional follow-up.  Surgical history to include vasectomy, colonoscopy in 2017, family history of father deceased, hyperlipidemia, Alzheimer's, hypertension mother, alive.  He is a former smoker quit in 1997, tobacco history.  He is married with 3 children.  He works at [employer].  Patient was referred for evaluation after having CT scan of the chest which revealed pulmonary nodules.  11/29/2019 CT chest revealed paraseptal emphysema in the lung apices with associated multiple pulmonary nodules the largest being 6 mm in the right lung base.  He has not had any previous pulmonary function tests on record no prior diagnosis of COPD.  However CT imaging does have evidence of emphysema.  OV 01/02/2020: Patient has no complaints today.  He works as a Estate agent.  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  He is slightly concerned today about this.  He did quit smoking many years ago.  No respiratory complaints at this time.  Denies hemoptysis.  Denies weight loss.  OV 06/28/2020: Patient here today for follow-up regarding CT scan of the chest and previous lung nodules.  He does have severe OSA.  He had follow-up imaging of his chest after finding incidental pulmonary nodules all subcentimeter in size.  Repeat imaging was completed last week which revealed similar stable right middle lobe and bilateral lower lobe pulmonary nodules.  Due to his prior smoking history and evidence of emphysema is considered high risk for  these nodules.  Patient now has had subsequent imaging from April 2021 to October 2021 that has documented stability.  We discussed a 1 year follow-up versus 24 months.  Patient does not seem to be concerned about the nodules today would like to stretch out the time between now and the next images.  He is no longer smoking.  At this time has no respiratory complaints.  OV 06/25/2022: here today for ct follow up. CT chest completed on 06/13/2022: Patient has bilateral mild nodules measuring up to 6 mm in size.  Stable since 2021 study.  No additional follow-up needed.  Patient has no respiratory complaints at this time.  He does have some septal emphysema located in the apices of the bilateral lungs.  We reviewed this today in the office on his CT imaging.    Past Medical History:  Diagnosis Date   High cholesterol    Migraine    OSA (obstructive sleep apnea)      Family History  Problem Relation Age of Onset   Colon polyps Mother    Alzheimer's disease Father    Hyperlipidemia Father    Hypertension Father      Past Surgical History:  Procedure Laterality Date   COLONOSCOPY     VASECTOMY      Social History   Socioeconomic History   Marital status: Married    Spouse name: Not on file   Number of children: Not on file   Years of education: Not on file   Highest education level: Not on file  Occupational History   Not on file  Tobacco Use   Smoking status: Former    Packs/day: 1.00    Years: 10.00    Total pack years: 10.00    Types: Cigarettes    Quit date: 73    Years since quitting: 26.8   Smokeless tobacco: Current  Substance and Sexual Activity   Alcohol use: Not on file   Drug use: Not on file   Sexual activity: Not on file  Other Topics Concern   Not on file  Social History Narrative   Not on file   Social Determinants of Health   Financial Resource Strain: Not on file  Food Insecurity: Not on file  Transportation Needs: Not on file  Physical  Activity: Not on file  Stress: Not on file  Social Connections: Not on file  Intimate Partner Violence: Not on file     No Known Allergies   Outpatient Medications Prior to Visit  Medication Sig Dispense Refill   fexofenadine (ALLEGRA) 60 MG tablet Take 60 mg by mouth as needed for allergies or rhinitis.     fluticasone (FLONASE) 50 MCG/ACT nasal spray Place 2 sprays into both nostrils daily.     atorvastatin (LIPITOR) 20 MG tablet Take 20 mg by mouth daily.     Multiple Vitamin (MULTIVITAMIN WITH MINERALS) TABS tablet Take 1 tablet by mouth daily.     VITAMIN D PO Take 1 tablet by mouth daily.     No facility-administered medications prior to visit.    Review of Systems  Constitutional:  Negative for chills, fever, malaise/fatigue and weight loss.  HENT:  Negative for hearing loss, sore throat and tinnitus.   Eyes:  Negative for blurred vision and double vision.  Respiratory:  Negative for cough, hemoptysis, sputum production, shortness of breath, wheezing and stridor.   Cardiovascular:  Negative for chest pain, palpitations, orthopnea, leg swelling and PND.  Gastrointestinal:  Negative for abdominal pain, constipation, diarrhea, heartburn, nausea and vomiting.  Genitourinary:  Negative for dysuria, hematuria and urgency.  Musculoskeletal:  Negative for joint pain and myalgias.  Skin:  Negative for itching and rash.  Neurological:  Negative for dizziness, tingling, weakness and headaches.  Endo/Heme/Allergies:  Negative for environmental allergies. Does not bruise/bleed easily.  Psychiatric/Behavioral:  Negative for depression. The patient is not nervous/anxious and does not have insomnia.   All other systems reviewed and are negative.    Objective:  Physical Exam Vitals reviewed.  Constitutional:      General: He is not in acute distress.    Appearance: He is well-developed.  HENT:     Head: Normocephalic and atraumatic.  Eyes:     General: No scleral icterus.     Conjunctiva/sclera: Conjunctivae normal.     Pupils: Pupils are equal, round, and reactive to light.  Neck:     Vascular: No JVD.     Trachea: No tracheal deviation.  Cardiovascular:     Rate and Rhythm: Normal rate and regular rhythm.     Heart sounds: Normal heart sounds. No murmur heard. Pulmonary:     Effort: Pulmonary effort is normal. No tachypnea, accessory muscle usage or respiratory distress.     Breath sounds: No stridor. No wheezing, rhonchi or rales.  Abdominal:     General: There is no distension.     Palpations: Abdomen is soft.     Tenderness: There is no abdominal tenderness.  Musculoskeletal:        General: No tenderness.  Cervical back: Neck supple.  Lymphadenopathy:     Cervical: No cervical adenopathy.  Skin:    General: Skin is warm and dry.     Capillary Refill: Capillary refill takes less than 2 seconds.     Findings: No rash.  Neurological:     Mental Status: He is alert and oriented to person, place, and time.  Psychiatric:        Behavior: Behavior normal.      Vitals:   06/25/22 1619  BP: 122/80  Pulse: 66  SpO2: 95%  Weight: 244 lb 9.6 oz (110.9 kg)  Height: 6' (1.829 m)   95% on RA BMI Readings from Last 3 Encounters:  06/25/22 33.17 kg/m  06/28/20 34.38 kg/m  01/02/20 33.17 kg/m   Wt Readings from Last 3 Encounters:  06/25/22 244 lb 9.6 oz (110.9 kg)  06/28/20 253 lb 8 oz (115 kg)  01/02/20 244 lb 9.6 oz (110.9 kg)    CBC    Component Value Date/Time   WBC 8.1 09/26/2019 0850   RBC 5.24 09/26/2019 0850   HGB 15.4 09/26/2019 0850   HCT 46.8 09/26/2019 0850   PLT 257 09/26/2019 0850   MCV 89.3 09/26/2019 0850   MCH 29.4 09/26/2019 0850   MCHC 32.9 09/26/2019 0850   RDW 12.0 09/26/2019 0850    Chest Imaging: 11/29/2019 CT chest: Evidence of paraseptal emphysema in the lung apices, centrilobular emphysema, multiple pulmonary nodules largest 6 mm in size.  Former smoker. The patient's images have been independently  reviewed by me.   06/19/2020: CT Chest  Noncontrasted CT of the chest reveals stable right middle lobe and lower lobe bilateral subcentimeter pulmonary nodules.  Evidence of emphysema.The patient's images have been independently reviewed by me.    October 2023 CT chest: Stable pulmonary nodules all smaller than 6 mm in size.  He has evidence of paraseptal emphysema in the apices of the lungs.  Pulmonary Functions Testing Results:     No data to display          FeNO: none  Pathology: none  Echocardiogram: none  Heart Catheterization: none    Assessment & Plan:     ICD-10-CM   1. Multiple lung nodules  R91.8     2. Paraseptal emphysema (HCC)  J43.8     3. Former smoker  Z87.891        Discussion: This is a 56 year old gentleman found to have an incidental small subcentimeter pulmonary nodules.  The largest is 6 mm in size.  He has associated upper lobe paraseptal emphysema.  He is a former smoker quit in 1997.  He is greater than 20 years out of smoking.  We documented 2-year follow-up of his lung nodules that have shows stability.  Plan: No additional CT imaging needed for his lung nodules. Continue to remain smoke-free. He does not have any current symptoms or shortness of breath. I think if he does develop any respiratory symptoms would be reasonable to have pulmonary function test at that time. Otherwise he can follow-up with [place] as needed in the future. Return to regular routine health maintenance with primary care provider. He is can call us and let us know if anything changes in his respiratory system or as other pulmonary related issues in the future.  We will be happy to see him back. Patient is agreeable to this plan.   Current Outpatient Medications:    fexofenadine (ALLEGRA) 60 MG tablet, Take 60 mg by mouth as needed for  allergies or rhinitis., Disp: , Rfl:    fluticasone (FLONASE) 50 MCG/ACT nasal spray, Place 2 sprays into both nostrils daily., Disp: ,  Rfl:    atorvastatin (LIPITOR) 20 MG tablet, Take 20 mg by mouth daily., Disp: , Rfl:    Multiple Vitamin (MULTIVITAMIN WITH MINERALS) TABS tablet, Take 1 tablet by mouth daily., Disp: , Rfl:    Josephine Igo, DO [facility] 06/25/2022 4:46 PM

## 2022-07-23 ENCOUNTER — Ambulatory Visit: Payer: Managed Care, Other (non HMO)

## 2022-07-23 ENCOUNTER — Ambulatory Visit
Admission: EM | Admit: 2022-07-23 | Discharge: 2022-07-23 | Disposition: A | Discharge status: Home / Self Care | Payer: Managed Care, Other (non HMO) | Attending: Urgent Care | Admitting: Urgent Care

## 2022-07-23 DIAGNOSIS — M25532 Pain in left wrist: Secondary | ICD-10-CM

## 2022-07-23 DIAGNOSIS — W19XXXA Unspecified fall, initial encounter: Secondary | ICD-10-CM

## 2022-07-23 DIAGNOSIS — S62102A Fracture of unspecified carpal bone, left wrist, initial encounter for closed fracture: Secondary | ICD-10-CM

## 2022-07-23 MED ORDER — NAPROXEN 500 MG PO TABS: 500.0000 mg | ORAL_TABLET | Freq: Two times a day (BID) | ORAL | 0 refills | Status: AC

## 2022-07-23 NOTE — ED Triage Notes (Signed)
Pt states he tripped/fell ~2 hours PTA-c/o left wrist pain that radiates up LUE-NAD-steady gait

## 2022-07-23 NOTE — Discharge Instructions (Addendum)
The x-ray from today is inconclusive.  There is no obvious fracture but the radiologist recommended being conservative.  As such I am going to place you into a wrist splint.  Please make sure you wear this at all times.  Use naproxen for pain and inflammation.  Follow-up with Dr. Roda Shutters or your own orthopedist soon as possible.

## 2022-07-23 NOTE — ED Provider Notes (Signed)
[facility]  Note:  This document was prepared using Conservation officer, historic buildings and may include unintentional dictation errors.  MRN: [id number] DOB: [birth year]  Subjective:   Ronald Lee is a 56 y.o. male presenting for suffering an accidental fall 2 hours ago.  Patient fell on an outstretched hand and sort most of the fall onto his left hand.  He has since felt significant pain of the radial aspect of his left wrist extending toward the ventral surface.  No bruising, swelling.  No current facility-administered medications for this encounter.  Current Outpatient Medications:    atorvastatin (LIPITOR) 20 MG tablet, Take 20 mg by mouth daily., Disp: , Rfl:    fexofenadine (ALLEGRA) 60 MG tablet, Take 60 mg by mouth as needed for allergies or rhinitis., Disp: , Rfl:    fluticasone (FLONASE) 50 MCG/ACT nasal spray, Place 2 sprays into both nostrils daily., Disp: , Rfl:    Multiple Vitamin (MULTIVITAMIN WITH MINERALS) TABS tablet, Take 1 tablet by mouth daily., Disp: , Rfl:    No Known Allergies  Past Medical History:  Diagnosis Date   High cholesterol    Migraine    OSA (obstructive sleep apnea)      Past Surgical History:  Procedure Laterality Date   COLONOSCOPY     VASECTOMY      Family History  Problem Relation Age of Onset   Colon polyps Mother    Alzheimer's disease Father    Hyperlipidemia Father    Hypertension Father     Social History   Tobacco Use   Smoking status: Former    Packs/day: 1.00    Years: 10.00    Total pack years: 10.00    Types: Cigarettes    Quit date: 1997    Years since quitting: 26.9   Smokeless tobacco: Never  Vaping Use   Vaping Use: Never used  Substance Use Topics   Alcohol use: Not Currently   Drug use: Not Currently    ROS   Objective:   Vitals: BP (!) 147/79 (BP Location: Right Arm)   Pulse 75   Temp 97.6 F (36.4 C) (Oral)   Resp 20   SpO2 96%   Physical Exam Constitutional:       General: He is not in acute distress.    Appearance: Normal appearance. He is well-developed and normal weight. He is not ill-appearing, toxic-appearing or diaphoretic.  HENT:     Head: Normocephalic and atraumatic.     Right Ear: External ear normal.     Left Ear: External ear normal.     Nose: Nose normal.     Mouth/Throat:     Pharynx: Oropharynx is clear.  Eyes:     General: No scleral icterus.       Right eye: No discharge.        Left eye: No discharge.     Extraocular Movements: Extraocular movements intact.  Cardiovascular:     Rate and Rhythm: Normal rate.  Pulmonary:     Effort: Pulmonary effort is normal.  Musculoskeletal:     Left wrist: Tenderness, bony tenderness and snuff box tenderness present. No swelling, deformity, effusion, lacerations or crepitus. Decreased range of motion.     Left hand: No swelling, deformity, lacerations, tenderness or bony tenderness. Normal range of motion. Normal strength. Normal sensation. Normal capillary refill.     Cervical back: Normal range of motion.  Neurological:     Mental Status: He is alert and oriented  to person, place, and time.  Psychiatric:        Mood and Affect: Mood normal.        Behavior: Behavior normal.        Thought Content: Thought content normal.        Judgment: Judgment normal.     DG Wrist Complete Left  Result Date: 07/23/2022 CLINICAL DATA:  Left wrist pain and injury, fall EXAM: LEFT WRIST - COMPLETE 3+ VIEW COMPARISON:  None Available. FINDINGS: Mild degenerative spurring at the first carpometacarpal articulation. Mild spurring along the volar distal radial rim. I do not observe a well-defined fracture. There is some osseous ridging in mid scaphoid without a definite scaphoid fracture. IMPRESSION: 1. No definite fracture is identified. If the patient has focal tenderness over the anatomic snuffbox or if pain persist despite conservative therapy, cross-sectional imaging may be warranted. 2. Mild  degenerative findings at the first carpometacarpal articulation and along the volar distal radial rim. Electronically Signed   By: Gaylyn Rong M.D.   On: 07/23/2022 15:17    Radial gutter splint was applied to the left wrist.  Assessment and Plan :   PDMP not reviewed this encounter.  1. Closed fracture of left wrist, initial encounter   2. Acute pain of left wrist     We will manage conservatively as recommended for what is considered an occult closed fracture of the left wrist.  Emphasized need for follow-up with an orthopedist for further imaging and evaluation.  In the meantime wear the splint at all times.  Use naproxen for pain and inflammation.  Provided the patient with a note for his work so that he can continue to work as he would like to. Counseled patient on potential for adverse effects with medications prescribed/recommended today, ER and return-to-clinic precautions discussed, patient verbalized understanding.    Wallis Bamberg, PA-C 07/23/22 1753

## 2022-07-24 ENCOUNTER — Encounter: Payer: Self-pay | Admitting: Surgery

## 2022-07-24 ENCOUNTER — Ambulatory Visit: Payer: Self-pay

## 2022-07-24 ENCOUNTER — Ambulatory Visit (INDEPENDENT_AMBULATORY_CARE_PROVIDER_SITE_OTHER): Payer: Managed Care, Other (non HMO) | Admitting: Surgery

## 2022-07-24 DIAGNOSIS — S63502A Unspecified sprain of left wrist, initial encounter: Secondary | ICD-10-CM | POA: Diagnosis not present

## 2022-07-30 NOTE — Progress Notes (Signed)
Office Visit Note   Patient: Ronald Lee           Date of Birth: [birth year]           MRN: [id number] Visit Date: 07/24/2022              Requested by: No referring provider defined for this encounter. PCP: Scifres, Dorothy, PA-C (Inactive)   Assessment & Plan: Visit Diagnoses:  1. Sprain of left wrist, initial encounter     Plan: patient was put into a removable wrist splint.  Recommend that he wear this at all times except when showering.  No lifting, pushing, pulling with left hand.  Dr Roda Shutters was on call when patient presented to the ED so patient will f/u with him in one week for recheck.   Follow-Up Instructions: Return in about 1 week (around 07/31/2022) for WITH DR Roda Shutters FOR RECHECK LEFT WRIST (PER Annalia Metzger).   Orders:  No orders of the defined types were placed in this encounter.  No orders of the defined types were placed in this encounter.     Procedures: No procedures performed   Clinical Data: No additional findings.   Subjective: Chief Complaint  Patient presents with   Left Wrist - Fracture    HPI 56 yo wm presents today with left wrist pain.  Patient fell yesterday onto an outstretched left hand and had immediate pain.  Now swelling.  Went to the urgent care yesterday and had xrays:  EXAM: LEFT WRIST - COMPLETE 3+ VIEW   COMPARISON:  None Available.   FINDINGS: Mild degenerative spurring at the first carpometacarpal articulation. Mild spurring along the volar distal radial rim. I do not observe a well-defined fracture. There is some osseous ridging in mid scaphoid without a definite scaphoid fracture.   IMPRESSION: 1. No definite fracture is identified. If the patient has focal tenderness over the anatomic snuffbox or if pain persist despite conservative therapy, cross-sectional imaging may be warranted. 2. Mild degenerative findings at the first carpometacarpal articulation and along the volar distal radial rim.     Electronically Signed   By:  Gaylyn Rong M.D.   On: 07/23/2022 15:17  Review of Systems No current c/o cardiac, pulm, gi, gu issues.   Objective: Vital Signs: There were no vitals taken for this visit.  Physical Exam HENT:     Head: Normocephalic and atraumatic.     Nose: Nose normal.  Eyes:     Extraocular Movements: Extraocular movements intact.  Pulmonary:     Effort: No respiratory distress.  Musculoskeletal:     Comments: Has good wrist ROM but with discomfort.  Mild to moderate tenderness distal radius.  Minimal swelling.  No bruising.    Neurological:     Mental Status: He is alert and oriented to person, place, and time.  Psychiatric:        Mood and Affect: Mood normal.     Ortho Exam  Specialty Comments:  No specialty comments available.  Imaging: No results found.   PMFS History: There are no problems to display for this patient.  Past Medical History:  Diagnosis Date   High cholesterol    Migraine    OSA (obstructive sleep apnea)     Family History  Problem Relation Age of Onset   Colon polyps Mother    Alzheimer's disease Father    Hyperlipidemia Father    Hypertension Father     Past Surgical History:  Procedure Laterality Date   COLONOSCOPY  VASECTOMY     Social History   Occupational History   Not on file  Tobacco Use   Smoking status: Former    Packs/day: 1.00    Years: 10.00    Total pack years: 10.00    Types: Cigarettes    Quit date: 1997    Years since quitting: 26.9   Smokeless tobacco: Never  Vaping Use   Vaping Use: Never used  Substance and Sexual Activity   Alcohol use: Not Currently   Drug use: Not Currently   Sexual activity: Not on file

## 2022-07-31 ENCOUNTER — Ambulatory Visit (INDEPENDENT_AMBULATORY_CARE_PROVIDER_SITE_OTHER): Payer: PRIVATE HEALTH INSURANCE | Admitting: Orthopaedic Surgery

## 2022-07-31 ENCOUNTER — Encounter: Payer: Self-pay | Admitting: Orthopaedic Surgery

## 2022-07-31 DIAGNOSIS — S63502A Unspecified sprain of left wrist, initial encounter: Secondary | ICD-10-CM | POA: Diagnosis not present

## 2022-07-31 NOTE — Progress Notes (Signed)
Office Visit Note   Patient: Ronald Lee           Date of Birth: 1966/08/22           MRN: [id number] Visit Date: 07/31/2022              Requested by: No referring provider defined for this encounter. PCP: Scifres, Dorothy, PA-C (Inactive)   Assessment & Plan: Visit Diagnoses:  1. Sprain of left wrist, initial encounter     Plan: Impression is left wrist sprain.  He has some pre-existing mild arthritis which may have been aggravated by the fall.  I recommend continuing with symptomatic treatment and wearing the brace as needed.  Work note provided.  Follow-up as needed.  Follow-Up Instructions: Return if symptoms worsen or fail to improve.   Orders:  No orders of the defined types were placed in this encounter.  No orders of the defined types were placed in this encounter.     Procedures: No procedures performed   Clinical Data: No additional findings.   Subjective: Chief Complaint  Patient presents with   Left Wrist - Pain    HPI Ronald Lee is a 56 year old gentleman following up today for left wrist pain status post mechanical fall.  He was seen at our office by Zonia Kief about a week ago.  He was a ER follow-up.  He slipped and fell on outstretched hand.  He has been wearing a Velcro splint.  Overall has felt better.  He is ready to return back to work.  Review of Systems  Constitutional: Negative.   HENT: Negative.    Eyes: Negative.   Respiratory: Negative.    Cardiovascular: Negative.   Gastrointestinal: Negative.   Endocrine: Negative.   Genitourinary: Negative.   Skin: Negative.   Allergic/Immunologic: Negative.   Neurological: Negative.   Hematological: Negative.   Psychiatric/Behavioral: Negative.    All other systems reviewed and are negative.    Objective: Vital Signs: There were no vitals taken for this visit.  Physical Exam Vitals and nursing note reviewed.  Constitutional:      Appearance: He is well-developed.  Pulmonary:      Effort: Pulmonary effort is normal.  Abdominal:     Palpations: Abdomen is soft.  Skin:    General: Skin is warm.  Neurological:     Mental Status: He is alert and oriented to person, place, and time.  Psychiatric:        Behavior: Behavior normal.        Thought Content: Thought content normal.        Judgment: Judgment normal.     Ortho Exam Examination left wrist shows no significant swelling.  He has mild generalized tenderness without any localization to any particular structures.  Range of motion is well-tolerated. Specialty Comments:  No specialty comments available.  Imaging: No results found.   PMFS History: There are no problems to display for this patient.  Past Medical History:  Diagnosis Date   High cholesterol    Migraine    OSA (obstructive sleep apnea)     Family History  Problem Relation Age of Onset   Colon polyps Mother    Alzheimer's disease Father    Hyperlipidemia Father    Hypertension Father     Past Surgical History:  Procedure Laterality Date   COLONOSCOPY     VASECTOMY     Social History   Occupational History   Not on file  Tobacco Use   Smoking status:  Former    Packs/day: 1.00    Years: 10.00    Total pack years: 10.00    Types: Cigarettes    Quit date: 1997    Years since quitting: 26.9   Smokeless tobacco: Never  Vaping Use   Vaping Use: Never used  Substance and Sexual Activity   Alcohol use: Not Currently   Drug use: Not Currently   Sexual activity: Not on file

## 2022-12-09 ENCOUNTER — Ambulatory Visit (INDEPENDENT_AMBULATORY_CARE_PROVIDER_SITE_OTHER): Payer: Managed Care, Other (non HMO) | Admitting: Podiatry

## 2022-12-09 ENCOUNTER — Encounter: Payer: Self-pay | Admitting: Podiatry

## 2022-12-09 ENCOUNTER — Ambulatory Visit (INDEPENDENT_AMBULATORY_CARE_PROVIDER_SITE_OTHER): Payer: Managed Care, Other (non HMO)

## 2022-12-09 DIAGNOSIS — M76829 Posterior tibial tendinitis, unspecified leg: Secondary | ICD-10-CM | POA: Diagnosis not present

## 2022-12-09 DIAGNOSIS — L570 Actinic keratosis: Secondary | ICD-10-CM | POA: Insufficient documentation

## 2022-12-09 DIAGNOSIS — L719 Rosacea, unspecified: Secondary | ICD-10-CM | POA: Insufficient documentation

## 2022-12-09 DIAGNOSIS — M7751 Other enthesopathy of right foot: Secondary | ICD-10-CM | POA: Diagnosis not present

## 2022-12-09 DIAGNOSIS — M2141 Flat foot [pes planus] (acquired), right foot: Secondary | ICD-10-CM | POA: Diagnosis not present

## 2022-12-09 DIAGNOSIS — L814 Other melanin hyperpigmentation: Secondary | ICD-10-CM | POA: Insufficient documentation

## 2022-12-09 DIAGNOSIS — J309 Allergic rhinitis, unspecified: Secondary | ICD-10-CM | POA: Insufficient documentation

## 2022-12-09 DIAGNOSIS — M25561 Pain in right knee: Secondary | ICD-10-CM | POA: Insufficient documentation

## 2022-12-09 DIAGNOSIS — Z23 Encounter for immunization: Secondary | ICD-10-CM | POA: Insufficient documentation

## 2022-12-09 DIAGNOSIS — D1801 Hemangioma of skin and subcutaneous tissue: Secondary | ICD-10-CM | POA: Insufficient documentation

## 2022-12-09 DIAGNOSIS — M722 Plantar fascial fibromatosis: Secondary | ICD-10-CM | POA: Insufficient documentation

## 2022-12-09 NOTE — Progress Notes (Signed)
Subjective:  Patient ID: Ronald Lee, male    DOB: Mar 04, 1966,  MRN: [id number] HPI Chief Complaint  Patient presents with   Ankle Pain    Lateral ankle right - aching x 2 years, noticed stance is different, foot turns out, seen podiatrist-had CT scan, wears a richey brace, 12 injections over the years, not getting better, doc suggested surgery-2nd opinion-doesn't want surgery   New Patient (Initial Visit)    57 y.o. male presents with the above complaint.   ROS: Denies fever chills nausea vomit muscle aches pains calf pain back pain chest pain shortness of breath.  Past Medical History:  Diagnosis Date   High cholesterol    Migraine    OSA (obstructive sleep apnea)    Past Surgical History:  Procedure Laterality Date   COLONOSCOPY     VASECTOMY      Current Outpatient Medications:    atorvastatin (LIPITOR) 20 MG tablet, Take 20 mg by mouth daily., Disp: , Rfl:    fexofenadine (ALLEGRA) 60 MG tablet, Take 60 mg by mouth as needed for allergies or rhinitis., Disp: , Rfl:    fluticasone (FLONASE) 50 MCG/ACT nasal spray, Place 2 sprays into both nostrils daily., Disp: , Rfl:    Multiple Vitamin (MULTIVITAMIN WITH MINERALS) TABS tablet, Take 1 tablet by mouth daily., Disp: , Rfl:    naproxen (NAPROSYN) 500 MG tablet, Take 1 tablet (500 mg total) by mouth 2 (two) times daily with a meal., Disp: 30 tablet, Rfl: 0   sildenafil (REVATIO) 20 MG tablet, Take 20 mg by mouth daily., Disp: , Rfl:   No Known Allergies Review of Systems Objective:  There were no vitals filed for this visit.  General: Well developed, nourished, in no acute distress, alert and oriented x3   Dermatological: Skin is warm, dry and supple bilateral. Nails x 10 are well maintained; remaining integument appears unremarkable at this time. There are no open sores, no preulcerative lesions, no rash or signs of infection present.  Vascular: Dorsalis Pedis artery and Posterior Tibial artery pedal pulses are 2/4  bilateral with immedate capillary fill time. Pedal hair growth present. No varicosities and no lower extremity edema present bilateral.   Neruologic: Grossly intact via light touch bilateral. Vibratory intact via tuning fork bilateral. Protective threshold with Semmes Wienstein monofilament intact to all pedal sites bilateral. Patellar and Achilles deep tendon reflexes 2+ bilateral. No Babinski or clonus noted bilateral.   Musculoskeletal: No gross boney pedal deformities bilateral. No pain, crepitus, or limitation noted with foot and ankle range of motion bilateral. Muscular strength 5/5 in all groups tested bilateral.  Moderate to severe flatfoot deformity of the right foot not on the left.  He has no inversion against resistance.  In the inversion is significantly reduced.  There is no crepitation on range of motion.  Gait: Unassisted, Nonantalgic.    Radiographs:  Radiographs taken today demonstrate an osseously mature foot and ankle as well.  Good mineralization of the bone.  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  No fractures are identified.  I reviewed his MRI that was taken by Dr. Elvin So.  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  Posterior tibial tendon dysfunction or rupture.  Plan: Discussed etiology pathology  conservative surgical therapies at this point I do think that a lot of his injection therapy had something to do with his rupture of the posterior tibial tendon and the plantar fascial area.  However this very well could have been the cause of the problem in the first place.  I do expect that he will continue the use of his Richie brace and I did discuss the fact  that he will need a medial column fusion as well as a subtalar joint fusion.  He understands this and is amenable to it but states that he is going to have to wait a few years until he retires.  He states that he is unable to have this done at any time in the near future due to work necessity.     [name] T. [place], [place]
